# Patient Record
Sex: Male | Born: 1965 | Hispanic: Yes | Marital: Single | Smoking: Never smoker
Health system: Southern US, Community
[De-identification: ages and names within clinical notes are randomized; demographics above are authoritative.]

---

## 2015-10-09 ENCOUNTER — Encounter: Payer: Self-pay | Admitting: Emergency Medicine

## 2015-10-09 ENCOUNTER — Emergency Department: Payer: Worker's Compensation

## 2015-10-09 ENCOUNTER — Emergency Department
Admission: EM | Admit: 2015-10-09 | Discharge: 2015-10-09 | Disposition: A | Discharge status: Home / Self Care | Payer: Worker's Compensation | Attending: Emergency Medicine | Admitting: Emergency Medicine

## 2015-10-09 DIAGNOSIS — W312XXA Contact with powered woodworking and forming machines, initial encounter: Secondary | ICD-10-CM | POA: Insufficient documentation

## 2015-10-09 DIAGNOSIS — Y990 Civilian activity done for income or pay: Secondary | ICD-10-CM | POA: Diagnosis not present

## 2015-10-09 DIAGNOSIS — S62639A Displaced fracture of distal phalanx of unspecified finger, initial encounter for closed fracture: Secondary | ICD-10-CM

## 2015-10-09 DIAGNOSIS — S62636A Displaced fracture of distal phalanx of right little finger, initial encounter for closed fracture: Secondary | ICD-10-CM | POA: Insufficient documentation

## 2015-10-09 DIAGNOSIS — S6721XA Crushing injury of right hand, initial encounter: Secondary | ICD-10-CM | POA: Insufficient documentation

## 2015-10-09 DIAGNOSIS — S6990XA Unspecified injury of unspecified wrist, hand and finger(s), initial encounter: Secondary | ICD-10-CM | POA: Diagnosis present

## 2015-10-09 DIAGNOSIS — Y9269 Other specified industrial and construction area as the place of occurrence of the external cause: Secondary | ICD-10-CM | POA: Diagnosis not present

## 2015-10-09 DIAGNOSIS — Z23 Encounter for immunization: Secondary | ICD-10-CM | POA: Insufficient documentation

## 2015-10-09 DIAGNOSIS — S6722XA Crushing injury of left hand, initial encounter: Secondary | ICD-10-CM | POA: Insufficient documentation

## 2015-10-09 DIAGNOSIS — Y9389 Activity, other specified: Secondary | ICD-10-CM | POA: Insufficient documentation

## 2015-10-09 MED ORDER — TETANUS-DIPHTH-ACELL PERTUSSIS 5-2.5-18.5 LF-MCG/0.5 IM SUSP
0.5000 mL | Freq: Once | INTRAMUSCULAR | Status: AC
  Administered 2015-10-09: 0.5 mL via INTRAMUSCULAR
  Filled 2015-10-09: qty 0.5

## 2015-10-09 MED ORDER — ONDANSETRON 8 MG PO TBDP
8.0000 mg | ORAL_TABLET | Freq: Once | ORAL | Status: AC
  Administered 2015-10-09: 8 mg via ORAL
  Filled 2015-10-09: qty 1

## 2015-10-09 MED ORDER — MELOXICAM 15 MG PO TABS: 15.0000 mg | ORAL_TABLET | Freq: Every day | ORAL | 0 refills | Status: AC

## 2015-10-09 MED ORDER — LIDOCAINE HCL (PF) 1 % IJ SOLN
20.0000 mL | Freq: Once | INTRAMUSCULAR | Status: AC
  Administered 2015-10-09: 20 mL
  Filled 2015-10-09: qty 20

## 2015-10-09 MED ORDER — MORPHINE SULFATE (PF) 4 MG/ML IV SOLN
4.0000 mg | Freq: Once | INTRAVENOUS | Status: AC
  Administered 2015-10-09: 4 mg via INTRAMUSCULAR
  Filled 2015-10-09: qty 1

## 2015-10-09 MED ORDER — HYDROCODONE-ACETAMINOPHEN 5-325 MG PO TABS: 1.0000 | ORAL_TABLET | ORAL | 0 refills | Status: AC | PRN

## 2015-10-09 NOTE — ED Provider Notes (Signed)
[facility] Emergency Department Provider Note  ____________________________________________  Time seen: Approximately 11:00 AM  I have reviewed the triage vital signs and the nursing notes.   HISTORY  Chief Complaint Hand Pain    HPI Paul May is a 50 y.o. male who presents with injury to left hand and right hand.  Patient states that he was pushing molding between two metal pieces and "mashed" his right 5th digit and left 4th digit between the metal.  Patient expressing severe degree of pain, with the greatest pain felt in the distal right 5th digit. Patient reports bleeding from the fourth digit of the left hand but is unsure of whether he suffered a laceration or not. Patient reports full range of motion to both digits. No other injury or complaint. Patient's last tetanus shot was 6-7 years ago.   History reviewed. No pertinent past medical history.  There are no active problems to display for this patient.   History reviewed. No pertinent surgical history.  Prior to Admission medications   Medication Sig Start Date End Date Taking? Authorizing Provider  HYDROcodone-acetaminophen (NORCO/VICODIN) 5-325 MG tablet Take 1 tablet by mouth every 4 (four) hours as needed for moderate pain. 10/09/15   Delorise Royals Cuthriell, PA-C  meloxicam (MOBIC) 15 MG tablet Take 1 tablet (15 mg total) by mouth daily. 10/09/15   Delorise Royals Cuthriell, PA-C    Allergies Review of patient's allergies indicates no known allergies.  No family history on file.  Social History Social History  Substance Use Topics  . Smoking status: Never Smoker  . Smokeless tobacco: Never Used  . Alcohol use Yes     Review of Systems  Constitutional: No fever/chills Cardiovascular: no chest pain. Respiratory: no cough. No SOB. Gastrointestinal:  No nausea, no vomiting.   Musculoskeletal: Positive for pain to the fifth digit of the right hand and fourth digit of the left hand.  Positive for bleeding to left finger, patient is unsure whether her finger was lacerated or more bleeding is coming from. Skin: Negative for rash, abrasions, lacerations, ecchymosis. Neurological: Negative for headaches, focal weakness or numbness. 10-point ROS otherwise negative.  ____________________________________________   PHYSICAL EXAM:  VITAL SIGNS: ED Triage Vitals [10/09/15 1048]  Enc Vitals Group     BP (!) 131/94     Pulse Rate 71     Resp 20     Temp 97.5 F (36.4 C)     Temp src      SpO2 98 %     Weight 165 lb (74.8 kg)     Height 5\' 5"  (1.651 m)     Head Circumference      Peak Flow      Pain Score 10     Pain Loc      Pain Edu?      Excl. in GC?      Constitutional: Alert and oriented. Well appearing and in no acute distress. Eyes: Conjunctivae are normal. PERRL. EOMI. Head: Atraumatic. Cardiovascular: Normal rate, regular rhythm. Normal S1 and S2.  Good peripheral circulation. Respiratory: Normal respiratory effort without tachypnea or retractions. Lungs CTAB. Good air entry to the bases with no decreased or absent breath sounds. Musculoskeletal: Full range of motion to all extremities. No gross deformities appreciated.Edema is noted to the distal aspect of the fifth digit right hand. Ecchymosis is noted. No subungual hematoma is appreciated. Area is extremely tender to palpation. No palpable abnormality. Full range of motion to finger. Examination of the  right hand otherwise is unremarkable. Examination of the fourth digit of the left hand reveals injury to the proximal nailbed. Mild bleeding from site. No definitive laceration. Ecchymosis underlying fingernail but no indication of acute subungual hematoma. Sensation and cap refill intact 5 digits both hands. Neurologic:  Normal speech and language. No gross focal neurologic deficits are appreciated.  Skin:  Skin is warm, dry and intact. No rash noted. Psychiatric: Mood and affect are normal. Speech and  behavior are normal. Patient exhibits appropriate insight and judgement.   ____________________________________________   LABS (all labs ordered are listed, but only abnormal results are displayed)  Labs Reviewed - No data to display ____________________________________________  EKG   ____________________________________________  RADIOLOGY Festus BarrenI, Jonathan D Cuthriell, personally viewed and evaluated these images (plain radiographs) as part of my medical decision making, as well as reviewing the written report by the radiologist.  Dg Hand Complete Left  Result Date: 10/09/2015 CLINICAL DATA:  Crush injury at work. EXAM: LEFT HAND - COMPLETE 3+ VIEW COMPARISON:  None. FINDINGS: Artifact overlies the distal ring finger. Question minimal fracture of the distal tuft. No other acute bone finding. 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 Tiny metallic density foreign object within the soft tissues in the web between the index and long fingers and in the region of the distal thumb nail bed. IMPRESSION: Question minimal fracture of the distal tuft of the ring finger. Artifact overlies this region. Electronically Signed   By: Paulina FusiMark  Shogry M.D.   On: 10/09/2015 11:47   Dg Hand Complete Right  Result Date: 10/09/2015 CLINICAL DATA:  Crush injury at work. EXAM: RIGHT HAND - COMPLETE 3+ VIEW COMPARISON:  None. FINDINGS: There is a fracture of the distal tuft of the small finger. Question minimal fracture of the distal tuft of the ring finger. No other abnormality. IMPRESSION: Definite fracture of the distal tuft of the small finger. Question minimal fracture of the distal tuft of the ring finger. Electronically Signed   By: Paulina FusiMark  Shogry M.D.   On: 10/09/2015 11:49    ____________________________________________    PROCEDURES  Procedure(s) performed:    Procedures    Medications  morphine 4 MG/ML injection 4 mg (4 mg Intramuscular Given 10/09/15 1110)  ondansetron (ZOFRAN-ODT)  disintegrating tablet 8 mg (8 mg Oral Given 10/09/15 1110)  lidocaine (PF) (XYLOCAINE) 1 % injection 20 mL (20 mLs Infiltration Given 10/09/15 1324)  Tdap (BOOSTRIX) injection 0.5 mL (0.5 mLs Intramuscular Given 10/09/15 1237)     ____________________________________________   INITIAL IMPRESSION / ASSESSMENT AND PLAN / ED COURSE  Pertinent labs & imaging results that were available during my care of the patient were reviewed by me and considered in my medical decision making (see chart for details).  Review of the [place] CSRS was performed in accordance of the [facility] prior to dispensing any controlled drugs.  Clinical Course    Patient's diagnosis is consistent with Crush injuries to the left and right hand. There is a fracture to the distal phalanx of the fifth digit right hand. Mild bleeding noted from nail bed of the fourth digit left hand. X-rays were obtained of both hands and revealed the above fracture. Patient was very upset initially with provider stating "I have blood building up in my finger and he needs to drain it." he was advised that he has swelling due to his injury but there is no indication of frank blood collection.  Patient does have bruising under the fingernails but there is no  indication of true subungual hematoma. Fourth digit of the left hand fifth digit of the right hand are splinted.Initially patient argued and was upset when I would not incise patient's soft tissues. However, after treatment with splinting, prescriptions, explanation of patient's clinical course, exam findings, diagnosis, patient became agreeable with diagnosis and treatment plan.. Patient will be discharged home with prescriptions for pain medication and anti-inflammatories. Patient is to follow up with hand surgeon. Patient is given ED precautions to return to the ED for any worsening or new symptoms.     ____________________________________________  FINAL CLINICAL IMPRESSION(S) / ED DIAGNOSES  Final  diagnoses:  Crush injury of hand, left, initial encounter  Crush injury of hand, right, initial encounter  Fracture, finger, distal phalanx, closed, initial encounter      NEW MEDICATIONS STARTED DURING THIS VISIT:  Discharge Medication List as of 10/09/2015  1:16 PM    START taking these medications   Details  HYDROcodone-acetaminophen (NORCO/VICODIN) 5-325 MG tablet Take 1 tablet by mouth every 4 (four) hours as needed for moderate pain., Starting Fri 10/09/2015, Print    meloxicam (MOBIC) 15 MG tablet Take 1 tablet (15 mg total) by mouth daily., Starting Fri 10/09/2015, Print            This chart was dictated using voice recognition software/Dragon. Despite best efforts to proofread, errors can occur which can change the meaning. Any change was purely unintentional.    Racheal Patches, PA-C 10/09/15 1426    Jeanmarie Plant, MD 10/09/15 [phone]

## 2015-10-09 NOTE — ED Triage Notes (Signed)
States he got both hands caught in a press at work

## 2015-10-15 ENCOUNTER — Encounter (HOSPITAL_BASED_OUTPATIENT_CLINIC_OR_DEPARTMENT_OTHER): Payer: Self-pay | Admitting: *Deleted

## 2015-10-15 ENCOUNTER — Other Ambulatory Visit: Payer: Self-pay | Admitting: Orthopedic Surgery

## 2015-10-16 ENCOUNTER — Encounter (HOSPITAL_BASED_OUTPATIENT_CLINIC_OR_DEPARTMENT_OTHER)
Admission: RE | Disposition: A | Discharge status: Home / Self Care | Payer: Self-pay | Source: Ambulatory Visit | Attending: Orthopedic Surgery

## 2015-10-16 ENCOUNTER — Ambulatory Visit (HOSPITAL_BASED_OUTPATIENT_CLINIC_OR_DEPARTMENT_OTHER): Payer: Worker's Compensation | Admitting: Anesthesiology

## 2015-10-16 ENCOUNTER — Ambulatory Visit (HOSPITAL_BASED_OUTPATIENT_CLINIC_OR_DEPARTMENT_OTHER)
Admission: RE | Admit: 2015-10-16 | Discharge: 2015-10-16 | Disposition: A | Discharge status: Home / Self Care | Payer: Worker's Compensation | Source: Ambulatory Visit | Attending: Orthopedic Surgery | Admitting: Orthopedic Surgery

## 2015-10-16 ENCOUNTER — Encounter (HOSPITAL_BASED_OUTPATIENT_CLINIC_OR_DEPARTMENT_OTHER): Payer: Self-pay | Admitting: Anesthesiology

## 2015-10-16 DIAGNOSIS — S62636B Displaced fracture of distal phalanx of right little finger, initial encounter for open fracture: Secondary | ICD-10-CM | POA: Diagnosis present

## 2015-10-16 DIAGNOSIS — S61316A Laceration without foreign body of right little finger with damage to nail, initial encounter: Secondary | ICD-10-CM | POA: Diagnosis not present

## 2015-10-16 DIAGNOSIS — S61314A Laceration without foreign body of right ring finger with damage to nail, initial encounter: Secondary | ICD-10-CM | POA: Insufficient documentation

## 2015-10-16 DIAGNOSIS — S62634B Displaced fracture of distal phalanx of right ring finger, initial encounter for open fracture: Secondary | ICD-10-CM | POA: Diagnosis not present

## 2015-10-16 HISTORY — PX: NAILBED REPAIR: SHX5028

## 2015-10-16 SURGERY — REPAIR, NAIL BED
Anesthesia: Monitor Anesthesia Care | Site: Finger | Laterality: Bilateral

## 2015-10-16 MED ORDER — CEFAZOLIN SODIUM-DEXTROSE 2-4 GM/100ML-% IV SOLN
INTRAVENOUS | Status: AC
  Filled 2015-10-16: qty 100

## 2015-10-16 MED ORDER — DOXYCYCLINE HYCLATE 50 MG PO CAPS: 50.0000 mg | ORAL_CAPSULE | Freq: Two times a day (BID) | ORAL | 0 refills | Status: AC

## 2015-10-16 MED ORDER — PROPOFOL 10 MG/ML IV BOLUS
INTRAVENOUS | Status: DC | PRN
  Administered 2015-10-16 (×2): 50 mg via INTRAVENOUS

## 2015-10-16 MED ORDER — ONDANSETRON HCL 4 MG/2ML IJ SOLN
INTRAMUSCULAR | Status: AC
  Filled 2015-10-16: qty 2

## 2015-10-16 MED ORDER — OXYCODONE HCL 5 MG PO TABS
ORAL_TABLET | ORAL | Status: AC
  Filled 2015-10-16: qty 1

## 2015-10-16 MED ORDER — CEFAZOLIN SODIUM-DEXTROSE 2-4 GM/100ML-% IV SOLN
2.0000 g | INTRAVENOUS | Status: AC
  Administered 2015-10-16: 2 g via INTRAVENOUS

## 2015-10-16 MED ORDER — MIDAZOLAM HCL 2 MG/2ML IJ SOLN: 1.0000 mg | INTRAMUSCULAR | Status: DC | PRN

## 2015-10-16 MED ORDER — FENTANYL CITRATE (PF) 100 MCG/2ML IJ SOLN: 50.0000 ug | INTRAMUSCULAR | Status: DC | PRN

## 2015-10-16 MED ORDER — FENTANYL CITRATE (PF) 100 MCG/2ML IJ SOLN
INTRAMUSCULAR | Status: AC
  Filled 2015-10-16: qty 2

## 2015-10-16 MED ORDER — MIDAZOLAM HCL 5 MG/5ML IJ SOLN
INTRAMUSCULAR | Status: DC | PRN
  Administered 2015-10-16: 2 mg via INTRAVENOUS

## 2015-10-16 MED ORDER — LIDOCAINE HCL (PF) 1 % IJ SOLN
INTRAMUSCULAR | Status: AC
  Filled 2015-10-16: qty 30

## 2015-10-16 MED ORDER — BUPIVACAINE HCL (PF) 0.25 % IJ SOLN
INTRAMUSCULAR | Status: AC
  Filled 2015-10-16: qty 30

## 2015-10-16 MED ORDER — PROPOFOL 500 MG/50ML IV EMUL
INTRAVENOUS | Status: AC
  Filled 2015-10-16: qty 50

## 2015-10-16 MED ORDER — GLYCOPYRROLATE 0.2 MG/ML IJ SOLN: 0.2000 mg | Freq: Once | INTRAMUSCULAR | Status: DC | PRN

## 2015-10-16 MED ORDER — LIDOCAINE HCL 1 % IJ SOLN
INTRAMUSCULAR | Status: DC | PRN
  Administered 2015-10-16: 12 mL via INTRAMUSCULAR

## 2015-10-16 MED ORDER — FENTANYL CITRATE (PF) 100 MCG/2ML IJ SOLN
INTRAMUSCULAR | Status: DC | PRN
  Administered 2015-10-16 (×2): 50 ug via INTRAVENOUS

## 2015-10-16 MED ORDER — DEXAMETHASONE SODIUM PHOSPHATE 10 MG/ML IJ SOLN
INTRAMUSCULAR | Status: AC
  Filled 2015-10-16: qty 1

## 2015-10-16 MED ORDER — MIDAZOLAM HCL 2 MG/2ML IJ SOLN
INTRAMUSCULAR | Status: AC
  Filled 2015-10-16: qty 2

## 2015-10-16 MED ORDER — LIDOCAINE 2% (20 MG/ML) 5 ML SYRINGE
INTRAMUSCULAR | Status: AC
  Filled 2015-10-16: qty 5

## 2015-10-16 MED ORDER — LACTATED RINGERS IV SOLN
INTRAVENOUS | Status: DC
  Administered 2015-10-16 (×2): via INTRAVENOUS

## 2015-10-16 MED ORDER — OXYCODONE HCL 5 MG PO TABS
5.0000 mg | ORAL_TABLET | ORAL | Status: DC | PRN
  Administered 2015-10-16: 5 mg via ORAL

## 2015-10-16 MED ORDER — SCOPOLAMINE 1 MG/3DAYS TD PT72: 1.0000 | MEDICATED_PATCH | Freq: Once | TRANSDERMAL | Status: DC | PRN

## 2015-10-16 MED ORDER — CHLORHEXIDINE GLUCONATE 4 % EX LIQD: 60.0000 mL | Freq: Once | CUTANEOUS | Status: DC

## 2015-10-16 MED ORDER — LIDOCAINE HCL (CARDIAC) 20 MG/ML IV SOLN
INTRAVENOUS | Status: DC | PRN
  Administered 2015-10-16: 10 mg via INTRAVENOUS

## 2015-10-16 MED ORDER — HYDROCODONE-ACETAMINOPHEN 5-325 MG PO TABS: 1.0000 | ORAL_TABLET | Freq: Four times a day (QID) | ORAL | 0 refills | Status: AC | PRN

## 2015-10-16 MED ORDER — ONDANSETRON HCL 4 MG/2ML IJ SOLN
INTRAMUSCULAR | Status: DC | PRN
  Administered 2015-10-16: 4 mg via INTRAVENOUS

## 2015-10-16 SURGICAL SUPPLY — 41 items
BLADE MINI RND TIP GREEN BEAV (BLADE) IMPLANT
BLADE SURG 15 STRL LF DISP TIS (BLADE) ×1 IMPLANT
BLADE SURG 15 STRL SS (BLADE) ×2
BNDG COHESIVE 1X5 TAN STRL LF (GAUZE/BANDAGES/DRESSINGS) ×3 IMPLANT
BNDG ESMARK 4X9 LF (GAUZE/BANDAGES/DRESSINGS) ×3 IMPLANT
CHLORAPREP W/TINT 26ML (MISCELLANEOUS) ×6 IMPLANT
CORDS BIPOLAR (ELECTRODE) IMPLANT
COVER BACK TABLE 60X90IN (DRAPES) ×3 IMPLANT
COVER MAYO STAND STRL (DRAPES) ×6 IMPLANT
CUFF TOURNIQUET SINGLE 18IN (TOURNIQUET CUFF) IMPLANT
DRAIN PENROSE 1/2X12 LTX STRL (WOUND CARE) ×3 IMPLANT
DRAPE EXTREMITY T 121X128X90 (DRAPE) ×6 IMPLANT
DRAPE SURG 17X23 STRL (DRAPES) ×6 IMPLANT
GAUZE SPONGE 4X4 12PLY STRL (GAUZE/BANDAGES/DRESSINGS) ×3 IMPLANT
GAUZE XEROFORM 1X8 LF (GAUZE/BANDAGES/DRESSINGS) ×3 IMPLANT
GLOVE BIOGEL PI IND STRL 7.0 (GLOVE) ×1 IMPLANT
GLOVE BIOGEL PI IND STRL 8.5 (GLOVE) ×1 IMPLANT
GLOVE BIOGEL PI INDICATOR 7.0 (GLOVE) ×2
GLOVE BIOGEL PI INDICATOR 8.5 (GLOVE) ×2
GLOVE ECLIPSE 6.5 STRL STRAW (GLOVE) ×3 IMPLANT
GLOVE EXAM NITRILE EXT CUFF MD (GLOVE) ×6 IMPLANT
GLOVE SURG ORTHO 8.0 STRL STRW (GLOVE) ×3 IMPLANT
GOWN STRL REUS W/ TWL LRG LVL3 (GOWN DISPOSABLE) ×1 IMPLANT
GOWN STRL REUS W/TWL LRG LVL3 (GOWN DISPOSABLE) ×2
GOWN STRL REUS W/TWL XL LVL3 (GOWN DISPOSABLE) ×3 IMPLANT
NEEDLE PRECISIONGLIDE 27X1.5 (NEEDLE) ×3 IMPLANT
NS IRRIG 1000ML POUR BTL (IV SOLUTION) ×3 IMPLANT
PACK BASIN DAY SURGERY FS (CUSTOM PROCEDURE TRAY) ×3 IMPLANT
PADDING CAST ABS 4INX4YD NS (CAST SUPPLIES)
PADDING CAST ABS COTTON 4X4 ST (CAST SUPPLIES) IMPLANT
SPLINT FINGER 3.25 BULB 911905 (SOFTGOODS) ×6 IMPLANT
STOCKINETTE 4X48 STRL (DRAPES) ×6 IMPLANT
SUT CHROMIC 6 0 G 1 (SUTURE) ×3 IMPLANT
SUT ETHILON 4 0 PS 2 18 (SUTURE) IMPLANT
SUT VIC AB 4-0 P2 18 (SUTURE) IMPLANT
SWAB COLLECTION DEVICE MRSA (MISCELLANEOUS) ×3 IMPLANT
SWAB CULTURE ESWAB REG 1ML (MISCELLANEOUS) ×3 IMPLANT
SYR BULB 3OZ (MISCELLANEOUS) ×3 IMPLANT
SYR CONTROL 10ML LL (SYRINGE) ×3 IMPLANT
TOWEL OR 17X24 6PK STRL BLUE (TOWEL DISPOSABLE) ×6 IMPLANT
UNDERPAD 30X30 (UNDERPADS AND DIAPERS) ×6 IMPLANT

## 2015-10-16 NOTE — Brief Op Note (Signed)
10/16/2015  2:25 PM  PATIENT:  Paul May  50 y.o. male  PRE-OPERATIVE DIAGNOSIS:  CRUSH INJURY RIGHT SMALL AND LEFT RING FINGERS.  FRACTURE DISTAL PHALANX LEFT RING FINGER.   562.609B , 567.22YA, 567.21YA, 562.635B  POST-OPERATIVE DIAGNOSIS:  CRUSH INJURY RIGHT SMALL AND LEFT RING FINGERS.  FRACTURE DISTAL PHALANX LEFT RING FINGER.   562.609B , 567.22YA, 567.21YA, 562.635B  PROCEDURE:  Procedure(s): RIGHT SMALL FINGER AND LEFT RING FINGER NAILBED REPAIRS (Bilateral)  SURGEON:  Surgeon(s) and Role:    * Cindee SaltGary Alexsys Eskin, MD - Primary  PHYSICIAN ASSISTANT:   ASSISTANTS: none   ANESTHESIA:   local and IV sedation  EBL:  Total I/O In: 1500 [I.V.:1500] Out: 3 [Blood:3]  BLOOD ADMINISTERED:none  DRAINS: none   LOCAL MEDICATIONS USED:  BUPIVICAINE and xylocaine  SPECIMEN:  Excision  DISPOSITION OF SPECIMEN:  PATHOLOGY  COUNTS:  YES  TOURNIQUET:  * No tourniquets in log *  DICTATION: .Other Dictation: Dictation Number [id number]  PLAN OF CARE: Discharge to home after PACU  PATIENT DISPOSITION:  PACU - hemodynamically stable.

## 2015-10-16 NOTE — Discharge Instructions (Addendum)

## 2015-10-16 NOTE — Transfer of Care (Signed)
Immediate Anesthesia Transfer of Care Note  Patient: Paul May  Procedure(s) Performed: Procedure(s): RIGHT SMALL FINGER AND LEFT RING FINGER NAILBED REPAIRS (Bilateral)  Patient Location: PACU  Anesthesia Type:MAC  Level of Consciousness: awake and patient cooperative  Airway & Oxygen Therapy: Patient Spontanous Breathing and Patient connected to face mask oxygen  Post-op Assessment: Report given to RN and Post -op Vital signs reviewed and stable  Post vital signs: Reviewed and stable  Last Vitals:  Vitals:   10/16/15 1234  BP: 121/73  Pulse: (!) 59  Resp: 18  Temp: 36.6 C    Last Pain:  Vitals:   10/16/15 1234  TempSrc: Oral  PainSc: 0-No pain      Patients Stated Pain Goal: 0 (10/16/15 1234)  Complications: No apparent anesthesia complications

## 2015-10-16 NOTE — H&P (Signed)
Paul DragonFelipe Luther May Vanessa BarbaraZamora is an 50 y.o. male.   Chief Complaint: crush injury right small and left ring finger HPI: Paul May is a 50 year old right-hand-dominant male who comes in referred from [facility]. He was seen there following a crush injury to his bilateral hands right small finger left ring finger. This occurred while he was at work. When it was caught in a mold press. He suffered injuries to the distal phalanges of each of the fingers. He complains of pain in his right small finger with a heavy feeling in a VAS score of 5/10. He states his pain has improved on his left ring finger. He is not complaining of much in the way of discomfort at this time. His he was not given an antibiotic. He was placed in dressings by [place]. The injury occurred on 10/09/2015. He has no prior history of injuries. Has no history of diabetes thyroid problems arthritis or gout. Complains of some radiating pain with a heavy feeling going up his midforearm on his right side from his small finger. His family history is negative for diabetes thyroid problems arthritis and gout. History reviewed. No pertinent past medical history.  History reviewed. No pertinent surgical history.      History reviewed. No pertinent past medical history.  History reviewed. No pertinent surgical history.  History reviewed. No pertinent family history. Social History:  reports that he has never smoked. He has never used smokeless tobacco. He reports that he drinks alcohol. He reports that he does not use drugs.  Allergies: No Known Allergies  Medications Prior to Admission  Medication Sig Dispense Refill  . HYDROcodone-acetaminophen (NORCO/VICODIN) 5-325 MG tablet Take 1 tablet by mouth every 4 (four) hours as needed for moderate pain. 20 tablet 0  . meloxicam (MOBIC) 15 MG tablet Take 1 tablet (15 mg total) by mouth daily. 30 tablet 0    No results found for this or any previous visit (from the past 48 hour(s)).  No results  found.   Pertinent items are noted in HPI.  Blood pressure 121/73, pulse (!) 59, temperature 97.8 F (36.6 C), temperature source Oral, resp. rate 18, height 5\' 5"  (1.651 m), weight 74.8 kg (165 lb), SpO2 98 %.  General appearance: alert, cooperative and appears stated age Head: Normocephalic, without obvious abnormality Neck: no JVD Resp: clear to auscultation bilaterally Cardio: regular rate and rhythm, S1, S2 normal, no murmur, click, rub or gallop GI: soft, non-tender; bowel sounds normal; no masses,  no organomegaly Extremities: crush injuries left and right hands Pulses: 2+ and symmetric Skin: Skin color, texture, turgor normal. No rashes or lesions Neurologic: Grossly normal Incision/Wound:  na  Assessment/Plan Assessment:  1. Fracture of phalanx of right hand, open, initial encounter [facility]  2. Crushing injury of finger of left hand, initial encounter [facility]  3. Crushing injury of finger, right, initial encounter [facility]  4. Open displaced fracture of distal phalanx of left ring finger, initial encounter [facility]    Plan: He would recommend a removal of both nail plates with repair of the nail beds on each side. Will be scheduled as an outpatient under MAC anesthesia. We will place him on Celebrex 200 mg 1-2 a day. We will hold off antibiotics until we can take cultures . he will be out of work. He is sent to therapy for protective splints.      Raylyn Carton  R 10/16/2015, 1:33 PM

## 2015-10-16 NOTE — Anesthesia Postprocedure Evaluation (Signed)
Anesthesia Post Note  Patient: Patrick JupiterFelipe Molina Zamora  Procedure(s) Performed: Procedure(s) (LRB): RIGHT SMALL FINGER AND LEFT RING FINGER NAILBED REPAIRS (Bilateral)  Patient location during evaluation: PACU Anesthesia Type: MAC Level of consciousness: awake and alert Pain management: pain level controlled Vital Signs Assessment: post-procedure vital signs reviewed and stable Respiratory status: spontaneous breathing, nonlabored ventilation and respiratory function stable Cardiovascular status: stable and blood pressure returned to baseline Anesthetic complications: no    Last Vitals:  Vitals:   10/16/15 1430 10/16/15 1445  BP: (!) 148/89   Pulse: (!) 52 (!) 49  Resp: 13 10  Temp: 36.4 C     Last Pain:  Vitals:   10/16/15 1500  TempSrc:   PainSc: 1                  Linton RumpJennifer Dickerson Rihana Kiddy

## 2015-10-16 NOTE — Op Note (Signed)
Dictation Number [id number]

## 2015-10-16 NOTE — Anesthesia Preprocedure Evaluation (Addendum)
Anesthesia Evaluation  Patient identified by MRN, date of birth, ID band Patient awake    Reviewed: Allergy & Precautions, NPO status , Patient's Chart, lab work & pertinent test results  History of Anesthesia Complications Negative for: history of anesthetic complications  Airway Mallampati: I  TM Distance: >3 FB Neck ROM: Full    Dental  (+)    Pulmonary neg pulmonary ROS,    Pulmonary exam normal breath sounds clear to auscultation       Cardiovascular negative cardio ROS   Rhythm:Regular Rate:Normal     Neuro/Psych negative neurological ROS     GI/Hepatic negative GI ROS, Neg liver ROS,   Endo/Other  negative endocrine ROS  Renal/GU negative Renal ROS     Musculoskeletal   Abdominal   Peds negative pediatric ROS (+)  Hematology   Anesthesia Other Findings   Reproductive/Obstetrics                            Anesthesia Physical Anesthesia Plan  ASA: I  Anesthesia Plan: MAC   Post-op Pain Management:    Induction: Intravenous  Airway Management Planned: Natural Airway and Simple Face Mask  Additional Equipment:   Intra-op Plan:   Post-operative Plan:   Informed Consent: I have reviewed the patients History and Physical, chart, labs and discussed the procedure including the risks, benefits and alternatives for the proposed anesthesia with the patient or authorized representative who has indicated his/her understanding and acceptance.   Dental advisory given  Plan Discussed with: CRNA  Anesthesia Plan Comments:        Anesthesia Quick Evaluation

## 2015-10-17 NOTE — Op Note (Signed)
NAMPatrick May:  MOLINA ZAMORA, [name]        ACCOUNT NO.:  [id number]  MEDICAL RECORD NO.:  [id number]  LOCATION:  ED42A                        FACILITY:  [facility]  PHYSICIAN:  Cindee SaltGary Sorina Derrig, M.D.       DATE OF BIRTH:  November 28, 1965  DATE OF PROCEDURE: DATE OF DISCHARGE:  10/09/2015                              OPERATIVE REPORT   PREOPERATIVE DIAGNOSIS: 1. Crush injury with nail bed injury. 2. Fractures right small finger distal phalanx and left ring finger     distal phalanx.  POSTOPERATIVE DIAGNOSIS: 1. Crush injury with nail bed injury. 2. Fractures right small finger distal phalanx and left ring finger     distal phalanx.  OPERATION: 1. Removal of nail plates. 2. Debridement of nail beds with repair of nail beds, right small     finger and left ring finger. 3. Cultures taken, left ring finger.  SURGEON:  Cindee SaltGary Deniro Laymon, MD.  ASSISTANT:  None.  ANESTHESIA:  Sedation with metacarpal blocks on each digit.  ANESTHESIOLOGIST:  Dr. Freida BusmanAllen.  HISTORY:  The patient is a 50 year old male, who suffered crush injuries to his right small finger and left ring finger.  He was seen in an outside emergency room, where bandages were placed.  He was seen with an avulsion of the proximal aspect of the left ring finger nail plate, which is sitting on top of the dorsal nail matrix proximally.  He has a large subungual hematoma under each nail.  He is admitted for removal of nail plates and repair of nail beds on each finger.  Pre, peri, and postoperative course have been discussed along with risks and complications.  He is aware there is no guarantee with the surgery, the possibility of infection, recurrence of injury to arteries, nerves, tendons, incomplete relief of symptoms, and dystrophy.  In the preoperative area, the patient was seen.  The extremity was marked by both the patient and the surgeon.  PROCEDURE IN DETAIL:  The patient was brought to the operating room, where a sedation was given.  He was  prepped with Betadine scrub and solution in a supine position with both arms free.  Time-out was taken confirming the patient and the procedure.  The ring finger was attended to first.  A metacarpal block was given with 0.25% bupivacaine and 1% Xylocaine, both without epinephrine, 6 mL total was used.  A Penrose drain was used for tourniquet control at the base of the finger after exsanguination with a gauze sponge.  The nail plate was then removed. It was only attached to very distal aspect of the nail matrix.  Purulent material immediately extruded.  Cultures were taken.  The area was sharply debrided with a knife removing black necrotic tissue.  This was sent for culture for both aerobic and anaerobic cultures.  The wound was copiously irrigated with saline.  The nail matrix was then repaired with interrupted 6-0 chromic sutures.  A nonadherent gauze was placed into the nail fold.  A sterile compressive dressing was applied.  The right small finger was attended to next.  This was exsanguinated with a moistened gauze sponge.  A Penrose drain again used for tourniquet control at the base of the finger.  This was done after  a metacarpal block was given with 0.25% bupivacaine and 1% Xylocaine, both without epinephrine.  Again total mL used were 6 for the small finger on the right hand.  The nail plate was removed without difficulty.  A stellate laceration involving almost the entire length of the nail matrix was noted, this was irrigated.  There was no necrotic tissue. The nail bed was then repaired with interrupted 6-0 chromic sutures.  A nail plate, which had been removed, was soaked in Betadine, any soft tissue removed from it.  This was then placed into the nail fold proximally.  A sterile compressive dressing and splint was applied over this.  The splint was then applied to the left ring finger.  The patient tolerated the procedure well and was taken to the recovery room for  observation in satisfactory condition.  It was noted that the Penrose drain was shortly removed from each finger prior to taking him to the recovery room.  The patient will be discharged home to return the [facility] in 1 week, on Norco and doxycycline.          ______________________________ Cindee Salt, M.D.     GK/MEDQ  D:  10/16/2015  T:  10/17/2015  Job:  [id number]

## 2015-10-19 ENCOUNTER — Encounter (HOSPITAL_BASED_OUTPATIENT_CLINIC_OR_DEPARTMENT_OTHER): Payer: Self-pay | Admitting: Orthopedic Surgery

## 2015-10-20 LAB — AEROBIC/ANAEROBIC CULTURE (SURGICAL/DEEP WOUND): GRAM STAIN: NONE SEEN

## 2015-10-20 LAB — AEROBIC/ANAEROBIC CULTURE W GRAM STAIN (SURGICAL/DEEP WOUND)

## 2017-04-05 ENCOUNTER — Emergency Department (HOSPITAL_COMMUNITY): Payer: Worker's Compensation

## 2017-04-05 ENCOUNTER — Emergency Department (HOSPITAL_COMMUNITY)
Admission: EM | Admit: 2017-04-05 | Discharge: 2017-04-05 | Disposition: A | Discharge status: Home / Self Care | Payer: Worker's Compensation | Attending: Emergency Medicine | Admitting: Emergency Medicine

## 2017-04-05 ENCOUNTER — Encounter (HOSPITAL_COMMUNITY): Payer: Self-pay | Admitting: *Deleted

## 2017-04-05 DIAGNOSIS — Y929 Unspecified place or not applicable: Secondary | ICD-10-CM | POA: Diagnosis not present

## 2017-04-05 DIAGNOSIS — Z23 Encounter for immunization: Secondary | ICD-10-CM | POA: Insufficient documentation

## 2017-04-05 DIAGNOSIS — S0990XA Unspecified injury of head, initial encounter: Secondary | ICD-10-CM

## 2017-04-05 DIAGNOSIS — Y990 Civilian activity done for income or pay: Secondary | ICD-10-CM | POA: Insufficient documentation

## 2017-04-05 DIAGNOSIS — Y939 Activity, unspecified: Secondary | ICD-10-CM | POA: Diagnosis not present

## 2017-04-05 DIAGNOSIS — S060X1A Concussion with loss of consciousness of 30 minutes or less, initial encounter: Secondary | ICD-10-CM

## 2017-04-05 DIAGNOSIS — R4182 Altered mental status, unspecified: Secondary | ICD-10-CM | POA: Diagnosis not present

## 2017-04-05 DIAGNOSIS — T1490XA Injury, unspecified, initial encounter: Secondary | ICD-10-CM

## 2017-04-05 DIAGNOSIS — S0101XA Laceration without foreign body of scalp, initial encounter: Secondary | ICD-10-CM | POA: Diagnosis not present

## 2017-04-05 DIAGNOSIS — R402412 Glasgow coma scale score 13-15, at arrival to emergency department: Secondary | ICD-10-CM | POA: Insufficient documentation

## 2017-04-05 DIAGNOSIS — W228XXA Striking against or struck by other objects, initial encounter: Secondary | ICD-10-CM | POA: Insufficient documentation

## 2017-04-05 LAB — URINALYSIS, ROUTINE W REFLEX MICROSCOPIC
BILIRUBIN URINE: NEGATIVE
Glucose, UA: NEGATIVE mg/dL
Hgb urine dipstick: NEGATIVE
KETONES UR: NEGATIVE mg/dL
LEUKOCYTES UA: NEGATIVE
NITRITE: NEGATIVE
PROTEIN: NEGATIVE mg/dL
Specific Gravity, Urine: 1.017 (ref 1.005–1.030)
pH: 6 (ref 5.0–8.0)

## 2017-04-05 LAB — I-STAT CHEM 8, ED
BUN: 10 mg/dL (ref 6–20)
Calcium, Ion: 1.03 mmol/L — ABNORMAL LOW (ref 1.15–1.40)
Chloride: 106 mmol/L (ref 101–111)
Creatinine, Ser: 0.6 mg/dL — ABNORMAL LOW (ref 0.61–1.24)
GLUCOSE: 117 mg/dL — AB (ref 65–99)
HCT: 52 % (ref 39.0–52.0)
HEMOGLOBIN: 17.7 g/dL — AB (ref 13.0–17.0)
Potassium: 4 mmol/L (ref 3.5–5.1)
Sodium: 141 mmol/L (ref 135–145)
TCO2: 24 mmol/L (ref 22–32)

## 2017-04-05 LAB — COMPREHENSIVE METABOLIC PANEL
ALT: 39 U/L (ref 17–63)
AST: 44 U/L — AB (ref 15–41)
Albumin: 4.2 g/dL (ref 3.5–5.0)
Alkaline Phosphatase: 86 U/L (ref 38–126)
Anion gap: 12 (ref 5–15)
BILIRUBIN TOTAL: 1 mg/dL (ref 0.3–1.2)
BUN: 8 mg/dL (ref 6–20)
CHLORIDE: 106 mmol/L (ref 101–111)
CO2: 22 mmol/L (ref 22–32)
CREATININE: 0.64 mg/dL (ref 0.61–1.24)
Calcium: 9.1 mg/dL (ref 8.9–10.3)
GFR calc non Af Amer: >60 mL/min (ref >60)
Glucose, Bld: 121 mg/dL — ABNORMAL HIGH (ref 65–99)
POTASSIUM: 4 mmol/L (ref 3.5–5.1)
Sodium: 140 mmol/L (ref 135–145)
TOTAL PROTEIN: 6.9 g/dL (ref 6.5–8.1)

## 2017-04-05 LAB — PROTIME-INR
INR: 0.97
PROTHROMBIN TIME: 12.8 s (ref 11.4–15.2)

## 2017-04-05 LAB — I-STAT CG4 LACTIC ACID, ED: LACTIC ACID, VENOUS: 2.26 mmol/L — AB (ref 0.5–1.9)

## 2017-04-05 LAB — ETHANOL: Alcohol, Ethyl (B): <10 mg/dL (ref <10)

## 2017-04-05 LAB — CBC
HEMATOCRIT: 50.9 % (ref 39.0–52.0)
Hemoglobin: 18.6 g/dL — ABNORMAL HIGH (ref 13.0–17.0)
MCH: 31.8 pg (ref 26.0–34.0)
MCHC: 36.5 g/dL — ABNORMAL HIGH (ref 30.0–36.0)
MCV: 87.2 fL (ref 78.0–100.0)
PLATELETS: 120 10*3/uL — AB (ref 150–400)
RBC: 5.84 MIL/uL — AB (ref 4.22–5.81)
RDW: 13.6 % (ref 11.5–15.5)
WBC: 7.2 10*3/uL (ref 4.0–10.5)

## 2017-04-05 LAB — SAMPLE TO BLOOD BANK

## 2017-04-05 LAB — CDS SEROLOGY

## 2017-04-05 MED ORDER — TETANUS-DIPHTH-ACELL PERTUSSIS 5-2.5-18.5 LF-MCG/0.5 IM SUSP
0.5000 mL | Freq: Once | INTRAMUSCULAR | Status: AC
  Administered 2017-04-05: 0.5 mL via INTRAMUSCULAR
  Filled 2017-04-05: qty 0.5

## 2017-04-05 MED ORDER — ONDANSETRON 4 MG PO TBDP
4.0000 mg | ORAL_TABLET | Freq: Once | ORAL | Status: AC
  Administered 2017-04-05: 4 mg via ORAL

## 2017-04-05 MED ORDER — ONDANSETRON 4 MG PO TBDP
ORAL_TABLET | ORAL | Status: AC
  Filled 2017-04-05: qty 1

## 2017-04-05 MED ORDER — ACETAMINOPHEN 500 MG PO TABS: 1000.0000 mg | ORAL_TABLET | Freq: Three times a day (TID) | ORAL | 0 refills | Status: DC

## 2017-04-05 MED ORDER — LIDOCAINE-EPINEPHRINE (PF) 2 %-1:200000 IJ SOLN: 10.0000 mL | Freq: Once | INTRAMUSCULAR | Status: DC

## 2017-04-05 MED ORDER — ACETAMINOPHEN 500 MG PO TABS: 1000.0000 mg | ORAL_TABLET | Freq: Three times a day (TID) | ORAL | 0 refills | Status: AC

## 2017-04-05 MED ORDER — IOPAMIDOL (ISOVUE-370) INJECTION 76%
INTRAVENOUS | Status: AC
  Administered 2017-04-05: 50 mL
  Filled 2017-04-05: qty 50

## 2017-04-05 MED ORDER — FENTANYL CITRATE (PF) 100 MCG/2ML IJ SOLN
50.0000 ug | Freq: Once | INTRAMUSCULAR | Status: AC
  Administered 2017-04-05: 50 ug via INTRAVENOUS
  Filled 2017-04-05: qty 2

## 2017-04-05 MED ORDER — SODIUM CHLORIDE 0.9 % IV BOLUS (SEPSIS)
1000.0000 mL | Freq: Once | INTRAVENOUS | Status: AC
  Administered 2017-04-05: 1000 mL via INTRAVENOUS

## 2017-04-05 NOTE — Progress Notes (Signed)
Orthopedic Tech Progress Note Patient Details:  Paul May [birth year] [id number]  Patient ID: Paul May, male   DOB: [birth year], 52 y.o.   MRN: [id number]   Nikki DomCrawford, Gracyn Allor 04/05/2017, 1:32 PM Made level 2 trauma visit

## 2017-04-05 NOTE — Discharge Instructions (Addendum)
Please call the concussion clinic to follow-up closely to that they can monitor your progression.  Please return to the emergency department in 5-7 days for staple removal.

## 2017-04-05 NOTE — ED Notes (Signed)
Pt was nauseated when sitting up-- zofran given-- stressed liquids only tonight--

## 2017-04-05 NOTE — ED Notes (Signed)
Lab results reported to Nurse Clydie BraunKaren.

## 2017-04-05 NOTE — ED Provider Notes (Addendum)
[facility] EMERGENCY DEPARTMENT Provider Note  CSN: [id number] Arrival date & time: 04/05/17 1315  Chief Complaint(s) Head Injury and level 2 trauma  HPI Paul May is a 52 y.o. male with no known past medical history presents to the emergency department as a level 2 trauma after head injury.  History is limited due to patient's altered mental status.  History obtained by EMS.  EMS reported that the patient was at work and was struck in the head by a jack.  Details surrounding this not well known as this was not witnessed by any other coworkers.  Apparently coworkers heard a loud sound and found the patient on the floor.  Upon EMS arrival patient was alert but nonverbal.  He remained hemodynamically stable in route.  Remainder of history, ROS, and physical exam limited due to patient's condition (AMS). Additional information was obtained from EMS.   Level V Caveat.    HPI  Past Medical History History reviewed. No pertinent past medical history. There are no active problems to display for this patient.  Home Medication(s) Prior to Admission medications   Medication Sig Start Date End Date Taking? Authorizing Provider  OVER THE COUNTER MEDICATION Take 1 tablet by mouth daily.   Yes [provider]  acetaminophen (TYLENOL) 500 MG tablet Take 2 tablets (1,000 mg total) by mouth every 8 (eight) hours for 5 days. Do not take more than 4000 mg of acetaminophen (Tylenol) in a 24-hour period. Please note that other medicines that you may be prescribed may have Tylenol as well. 04/05/17 04/10/17  Nira Conn, MD  doxycycline (VIBRAMYCIN) 50 MG capsule Take 1 capsule (50 mg total) by mouth 2 (two) times daily. Patient not taking: Reported on 04/05/2017 10/16/15   Cindee Salt, MD  HYDROcodone-acetaminophen (NORCO) 5-325 MG tablet Take 1 tablet by mouth every 6 (six) hours as needed for moderate pain. Patient not taking: Reported on 04/05/2017 10/16/15    Cindee Salt, MD  HYDROcodone-acetaminophen (NORCO/VICODIN) 5-325 MG tablet Take 1 tablet by mouth every 4 (four) hours as needed for moderate pain. Patient not taking: Reported on 04/05/2017 10/09/15   Cuthriell, Delorise Royals, PA-C  meloxicam (MOBIC) 15 MG tablet Take 1 tablet (15 mg total) by mouth daily. Patient not taking: Reported on 04/05/2017 10/09/15   Cuthriell, Delorise Royals, PA-C                                                                                                                                    Past Surgical History Past Surgical History:  Procedure Laterality Date  . NAILBED REPAIR Bilateral 10/16/2015   Procedure: RIGHT SMALL FINGER AND LEFT RING FINGER NAILBED REPAIRS;  Surgeon: Cindee Salt, MD;  Location: [facility];  Service: Orthopedics;  Laterality: Bilateral;   Family History No family history on file.  Social History Social History   Tobacco Use  . Smoking status: Never Smoker  .  Smokeless tobacco: Never Used  Substance Use Topics  . Alcohol use: Yes  . Drug use: No   Allergies Patient has no known allergies.  Review of Systems Review of Systems  Unable to perform ROS: Patient nonverbal    Physical Exam Vital Signs  I have reviewed the triage vital signs BP 122/85   Pulse 71   Temp 99.7 F (37.6 C) (Temporal)   Resp 16   Ht 5\' 7"  (1.702 m)   Wt 74.8 kg (165 lb)   SpO2 93%   BMI 25.84 kg/m   Physical Exam  Constitutional: He appears well-developed and well-nourished. No distress.  HENT:  Head: Normocephalic. Head is with laceration (4-5 cm linear laceration to the left parietal scalp.  Hemostatic.).  Right Ear: External ear normal.  Left Ear: External ear normal.  Mouth/Throat: Oropharynx is clear and moist.  Eyes: Conjunctivae and EOM are normal. Pupils are equal, round, and reactive to light. Right eye exhibits no discharge. Left eye exhibits no discharge. No scleral icterus.  Neck: Normal range of motion. Neck supple.    Cardiovascular: Regular rhythm and normal heart sounds. Exam reveals no gallop and no friction rub.  No murmur heard. Pulses:      Radial pulses are 2+ on the right side, and 2+ on the left side.       Dorsalis pedis pulses are 2+ on the right side, and 2+ on the left side.  Pulmonary/Chest: Effort normal and breath sounds normal. No stridor. No respiratory distress.  Abdominal: Soft. He exhibits no distension. There is no tenderness.  Musculoskeletal:       Cervical back: He exhibits no bony tenderness.       Thoracic back: He exhibits no bony tenderness.       Lumbar back: He exhibits no bony tenderness.  Clavicle stable. Chest stable to AP/Lat compression. Pelvis stable to Lat compression. No obvious extremity deformity. No chest or abdominal wall contusion.  Neurological: He is alert. GCS eye subscore is 4. GCS verbal subscore is 5. GCS motor subscore is 6.  Patient is nonverbal, but follows commands. Moving all extremities.   Skin: Skin is warm. He is not diaphoretic.    ED Results and Treatments Labs (all labs ordered are listed, but only abnormal results are displayed) Labs Reviewed  COMPREHENSIVE METABOLIC PANEL - Abnormal; Notable for the following components:      Result Value   Glucose, Bld 121 (*)    AST 44 (*)    All other components within normal limits  CBC - Abnormal; Notable for the following components:   RBC 5.84 (*)    Hemoglobin 18.6 (*)    MCHC 36.5 (*)    Platelets 120 (*)    All other components within normal limits  URINALYSIS, ROUTINE W REFLEX MICROSCOPIC - Abnormal; Notable for the following components:   Color, Urine STRAW (*)    All other components within normal limits  I-STAT CHEM 8, ED - Abnormal; Notable for the following components:   Creatinine, Ser 0.60 (*)    Glucose, Bld 117 (*)    Calcium, Ion 1.03 (*)    Hemoglobin 17.7 (*)    All other components within normal limits  I-STAT CG4 LACTIC ACID, ED - Abnormal; Notable for the  following components:   Lactic Acid, Venous 2.26 (*)    All other components within normal limits  ETHANOL  CDS SEROLOGY  PROTIME-INR  SAMPLE TO BLOOD BANK  EKG  EKG Interpretation  Date/Time:    Ventricular Rate:    PR Interval:    QRS Duration:   QT Interval:    QTC Calculation:   R Axis:     Text Interpretation:        Radiology Ct Angio Head W Or Wo Contrast  Result Date: 04/05/2017 CLINICAL DATA:  Struck on the top of the head by machinery. Loss of consciousness. Not speaking. EXAM: CT ANGIOGRAPHY HEAD AND NECK TECHNIQUE: Multidetector CT imaging of the head and neck was performed using the standard protocol during bolus administration of intravenous contrast. Multiplanar CT image reconstructions and MIPs were obtained to evaluate the vascular anatomy. Carotid stenosis measurements (when applicable) are obtained utilizing NASCET criteria, using the distal internal carotid diameter as the denominator. CONTRAST:  50mL ISOVUE-370 IOPAMIDOL (ISOVUE-370) INJECTION 76% COMPARISON:  None. FINDINGS: CT HEAD FINDINGS Brain: The brain shows a normal appearance without evidence of malformation, atrophy, old or acute small or large vessel infarction, mass lesion, hemorrhage, hydrocephalus or extra-axial collection. Vascular: No hyperdense vessel. No evidence of atherosclerotic calcification. Skull: Normal.  No traumatic finding.  No focal bone lesion. Sinuses/Orbits: Sinuses are clear. Orbits appear normal. Mastoids are clear. Other: Scalp injury at the left parietal vertex. No sign of radiopaque foreign object. CTA NECK FINDINGS Aortic arch: Normal Right carotid system: Normal. No bifurcation disease. No cervical ICA abnormality. Left carotid system: Normal. No bifurcation disease. No cervical ICA abnormality. Vertebral arteries: Vertebral artery origins are widely patent. Both  vertebral arteries are widely patent through the cervical region to the foramen magnum. Skeleton: Normal Other neck: No soft tissue mass or neck injury identified. Upper chest: Normal Review of the MIP images confirms the above findings CTA HEAD FINDINGS Anterior circulation: Both internal carotid arteries are widely patent through the skull base and siphon regions. No atherosclerotic calcification. Anterior and middle cerebral vessels are normal. Posterior circulation: Both vertebral arteries are widely patent through the foramen magnum to the basilar. No basilar stenosis. Posterior circulation branch vessels are normal. Venous sinuses: Normal Anatomic variants: None Delayed phase: No abnormal enhancement Review of the MIP images confirms the above findings IMPRESSION: Head CT normal except for a left parietal scalp laceration. CT angiography of the neck and head are normal. Electronically Signed   By: Paulina FusiMark  Shogry M.D.   On: 04/05/2017 14:07   Dg Forearm Right  Result Date: 04/05/2017 CLINICAL DATA:  Level 2 trauma. Patient was crushed by a piece of machinery at work today. EXAM: RIGHT FOREARM - 2 VIEW COMPARISON:  None. FINDINGS: There is no evidence of fracture or other focal bone lesions. No joint dislocations. Normal vascular channel involving the proximal radial diaphysis. Slight irregular soft tissue swelling along the ulnar and dorsal aspect of the forearm as well as along the volar aspect of the distal forearm. Minimal enthesopathy at the triceps insertion of the olecranon. IMPRESSION: Soft tissue swelling of the right forearm. No acute osseous abnormality. Electronically Signed   By: Tollie Ethavid  Kwon M.D.   On: 04/05/2017 14:57   Ct Angio Neck W And/or Wo Contrast  Result Date: 04/05/2017 CLINICAL DATA:  Struck on the top of the head by machinery. Loss of consciousness. Not speaking. EXAM: CT ANGIOGRAPHY HEAD AND NECK TECHNIQUE: Multidetector CT imaging of the head and neck was performed using the  standard protocol during bolus administration of intravenous contrast. Multiplanar CT image reconstructions and MIPs were obtained to evaluate the vascular anatomy. Carotid stenosis measurements (when applicable) are obtained utilizing  NASCET criteria, using the distal internal carotid diameter as the denominator. CONTRAST:  50mL ISOVUE-370 IOPAMIDOL (ISOVUE-370) INJECTION 76% COMPARISON:  None. FINDINGS: CT HEAD FINDINGS Brain: The brain shows a normal appearance without evidence of malformation, atrophy, old or acute small or large vessel infarction, mass lesion, hemorrhage, hydrocephalus or extra-axial collection. Vascular: No hyperdense vessel. No evidence of atherosclerotic calcification. Skull: Normal.  No traumatic finding.  No focal bone lesion. Sinuses/Orbits: Sinuses are clear. Orbits appear normal. Mastoids are clear. Other: Scalp injury at the left parietal vertex. No sign of radiopaque foreign object. CTA NECK FINDINGS Aortic arch: Normal Right carotid system: Normal. No bifurcation disease. No cervical ICA abnormality. Left carotid system: Normal. No bifurcation disease. No cervical ICA abnormality. Vertebral arteries: Vertebral artery origins are widely patent. Both vertebral arteries are widely patent through the cervical region to the foramen magnum. Skeleton: Normal Other neck: No soft tissue mass or neck injury identified. Upper chest: Normal Review of the MIP images confirms the above findings CTA HEAD FINDINGS Anterior circulation: Both internal carotid arteries are widely patent through the skull base and siphon regions. No atherosclerotic calcification. Anterior and middle cerebral vessels are normal. Posterior circulation: Both vertebral arteries are widely patent through the foramen magnum to the basilar. No basilar stenosis. Posterior circulation branch vessels are normal. Venous sinuses: Normal Anatomic variants: None Delayed phase: No abnormal enhancement Review of the MIP images confirms  the above findings IMPRESSION: Head CT normal except for a left parietal scalp laceration. CT angiography of the neck and head are normal. Electronically Signed   By: Paulina Fusi M.D.   On: 04/05/2017 14:07   Ct C-spine No Charge  Result Date: 04/05/2017 CLINICAL DATA:  52 year old male status post level 2 trauma, heavy object fell on top of the patient's head. Loss of consciousness. EXAM: CT Cervical Spine with contrast TECHNIQUE: Multiplanar CT images of the cervical spine were reconstructed from contemporary CTA of the Neck. CONTRAST:  None additional COMPARISON:  CTA head and neck today reported separately. FINDINGS: Alignment: Straightening of cervical lordosis. No spondylolisthesis. Bilateral posterior element alignment is within normal limits. Vertebrae: Visualized skull base is intact. No atlanto-occipital dissociation. C1 and C2 are intact and normally aligned. No acute cervical spine fracture is identified. There is some loss of height of the C6 vertebral body (sagittal image 34), but this appears to be chronic or congenital. Soft tissues: No hemorrhage identified within the bony spinal canal. No prevertebral soft tissue fluid or edema identified. Other paraspinal soft tissue findings are reported with the CTA today separately. Disc levels: Cervical disc space loss and endplate degeneration with circumferential endplate spurring at C5-C6 and C6-C7. Up to mild associated spinal stenosis. Upper chest: Visible upper thoracic levels appear intact. Negative lung apices. Other: Visualized paranasal sinuses and mastoids are well pneumatized. Elongated bilateral stylohyoid ligament calcification, greater on the right. IMPRESSION: 1.  No acute osseous abnormality in the cervical spine. 2. CTA head and neck today are reported separately. 3. 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 Electronically Signed   By: Odessa Fleming M.D.   On: 04/05/2017 14:14    Pertinent labs & imaging results that were available during my care of the patient were reviewed by me and considered in my medical decision making (see chart for details).  Medications Ordered in ED Medications  iopamidol (ISOVUE-370) 76 % injection (50 mLs  Contrast Given 04/05/17 1330)  fentaNYL (SUBLIMAZE) injection 50 mcg (50 mcg Intravenous  Given 04/05/17 1412)  Tdap (BOOSTRIX) injection 0.5 mL (0.5 mLs Intramuscular Given 04/05/17 1419)  sodium chloride 0.9 % bolus 1,000 mL (0 mLs Intravenous Stopped 04/05/17 1705)  ondansetron (ZOFRAN-ODT) disintegrating tablet 4 mg (4 mg Oral Given 04/05/17 1816)                                                                                                                                    Procedures .Marland KitchenLaceration Repair Date/Time: 04/05/2017 3:14 PM Performed by: Nira Conn, MD Authorized by: Nira Conn, MD   Consent:    Consent obtained:  Verbal   Consent given by:  Spouse   Risks discussed:  Poor cosmetic result   Alternatives discussed:  No treatment Laceration details:    Location:  Scalp   Scalp location:  L parietal   Length (cm):  4   Depth (mm):  3 Repair type:    Repair type:  Simple Pre-procedure details:    Preparation:  Patient was prepped and draped in usual sterile fashion and imaging obtained to evaluate for foreign bodies Exploration:    Contaminated: no   Treatment:    Area cleansed with:  Betadine   Amount of cleaning:  Standard   Irrigation solution:  Sterile saline   Irrigation method:  Syringe Skin repair:    Repair method:  Staples   Number of staples:  3 Approximation:    Approximation:  Close   Vermilion border: well-aligned   Post-procedure details:    Dressing:  Antibiotic ointment   Patient tolerance of procedure:  Tolerated well, no immediate complications    (including critical care time)  Medical Decision Making / ED Course I have reviewed the nursing notes for this encounter  and the patient's prior records (if available in EHR or on provided paperwork).    Level 2 trauma due to altered mental status in the setting of head trauma.  Left parietal scalp laceration with no underlying instability.  Imaging reassuring without evidence of fracture, ICH.  Tetanus updated.  Laceration was closed with 3 staples.  Patient was monitored for several hours and is more interactive and now verbal.  Altered mental status likely from concussion which appears to be improving.  Patient has good support system at home with wife and daughter at bedside.  Recommended close follow-up with concussion clinic.  Return in 5-7 days for suture removal.  The patient is safe for discharge with strict return precautions.   Final Clinical Impression(s) / ED Diagnoses Final diagnoses:  Closed head injury, initial encounter  Laceration of scalp, initial encounter  Concussion with loss of consciousness of 30 minutes or less, initial encounter   Disposition: Discharge  Condition: Good  I have discussed the results, Dx and Tx plan with the patient who expressed understanding and agree(s) with the plan. Discharge instructions discussed at great length. The patient was given strict return precautions who verbalized understanding of the  instructions. No further questions at time of discharge.    ED Discharge Orders        Ordered    acetaminophen (TYLENOL) 500 MG tablet  Every 8 hours,   Status:  Discontinued     04/05/17 1631    acetaminophen (TYLENOL) 500 MG tablet  Every 8 hours     04/05/17 1705       Follow Up: Judi Saa, DO [address] [place] [address] [place] [address] [phone]  Schedule an appointment as soon as possible for a visit  For close follow up to assess for concussion  [facility] EMERGENCY DEPARTMENT [address] [id number] mc [place] [address] [phone] Go to  in 5-7 days, For suture removal      This  chart was dictated using voice recognition software.  Despite best efforts to proofread,  errors can occur which can change the documentation meaning.   Nira Conn, MD 04/06/17 1041    Nira Conn, MD 04/12/17 [phone]

## 2017-04-12 ENCOUNTER — Ambulatory Visit
Admission: RE | Admit: 2017-04-12 | Discharge: 2017-04-12 | Disposition: A | Discharge status: Home / Self Care | Payer: Worker's Compensation | Source: Ambulatory Visit | Attending: Family Medicine | Admitting: Family Medicine

## 2017-04-12 ENCOUNTER — Other Ambulatory Visit: Payer: Self-pay | Admitting: Family Medicine

## 2017-04-12 DIAGNOSIS — S62634A Displaced fracture of distal phalanx of right ring finger, initial encounter for closed fracture: Secondary | ICD-10-CM | POA: Insufficient documentation

## 2017-04-12 DIAGNOSIS — X58XXXA Exposure to other specified factors, initial encounter: Secondary | ICD-10-CM | POA: Diagnosis not present

## 2017-04-12 DIAGNOSIS — M79601 Pain in right arm: Secondary | ICD-10-CM

## 2017-04-12 DIAGNOSIS — T1490XA Injury, unspecified, initial encounter: Secondary | ICD-10-CM

## 2018-10-14 IMAGING — CT CT CERVICAL SPINE W/O CM
3 of 17 series · 9 of 33 positions shown, 10 images · IV contrast (agent unspecified)
Comparison: CTA head and neck today reported separately.

CLINICAL DATA: 51-year-old male status post level 2 trauma, heavy
object fell on top of the patient's head. Loss of consciousness.

EXAM:
CT Cervical Spine with contrast
TECHNIQUE: Multiplanar CT images of the cervical spine were reconstructed from
contemporary CTA of the Neck.
CONTRAST:  None additional

[Series 7: ax thins · axial · 0.39mm/px · z∈[-248,-76]mm · 3 of 343 slices shown, 4 images]
[im 86/343  soft-tissue]
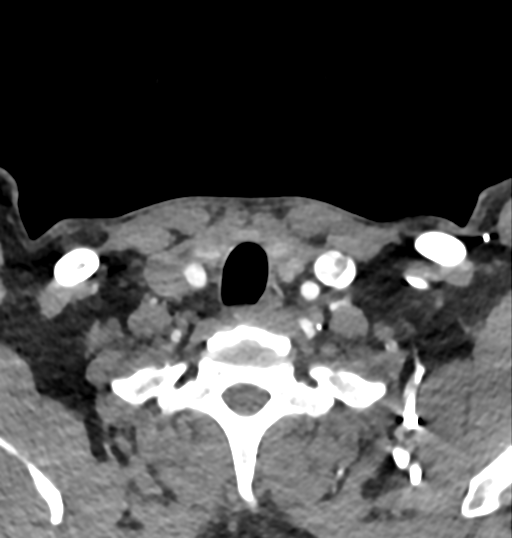
[im 86/343  bone]
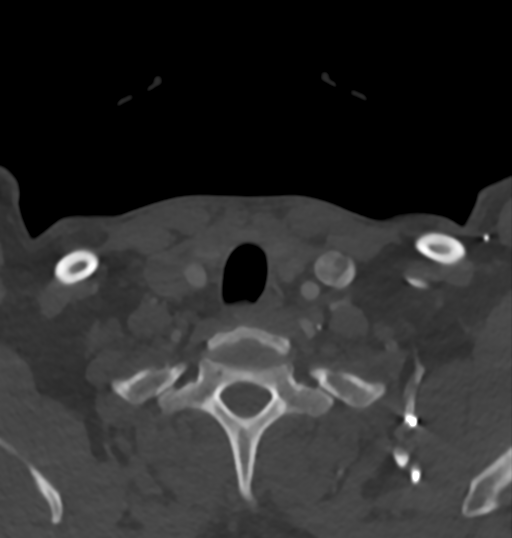
[im 172/343  bone]
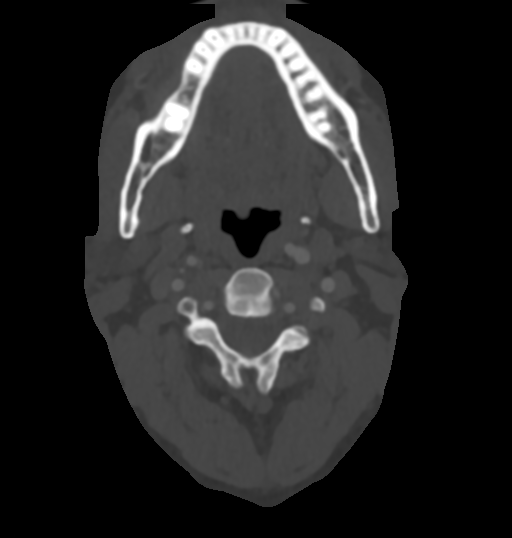
[im 257/343  bone]
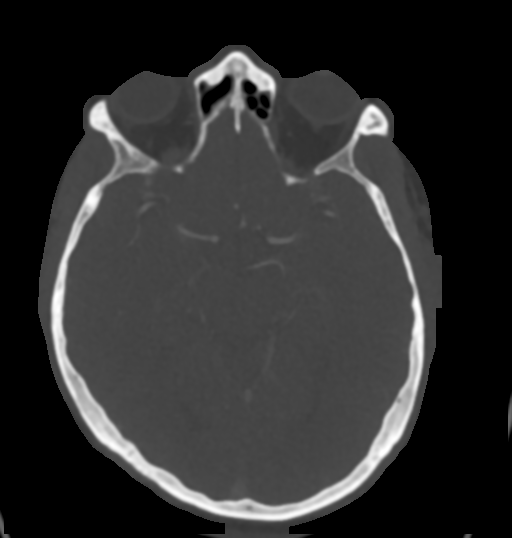

[Series 8: cor thins · coronal · 0.35mm/px · 1 of 201 slices shown]
[im 101/201  bone]
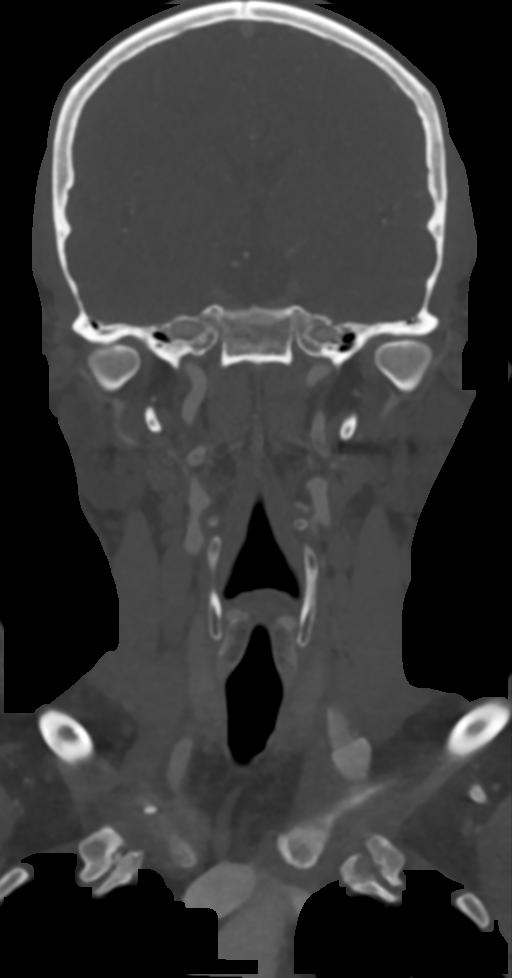

[Series 9: sag thins · sagittal · 0.45mm/px · 5 of 147 slices shown]
[im 25/147  bone]
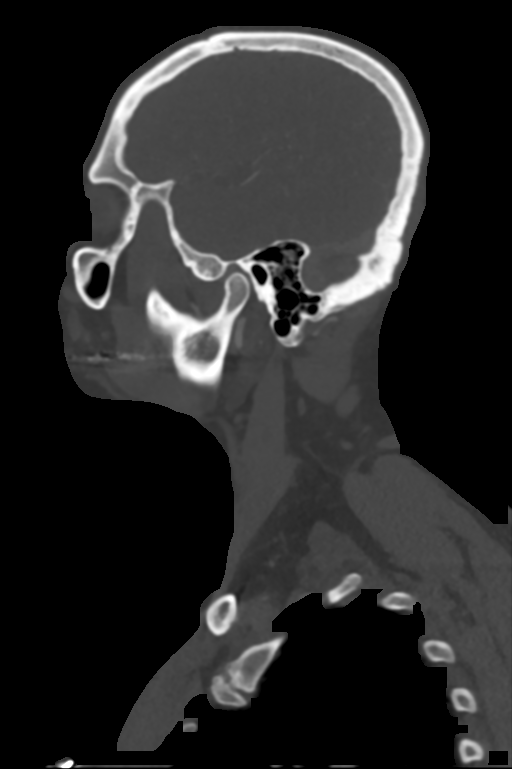
[im 49/147  bone]
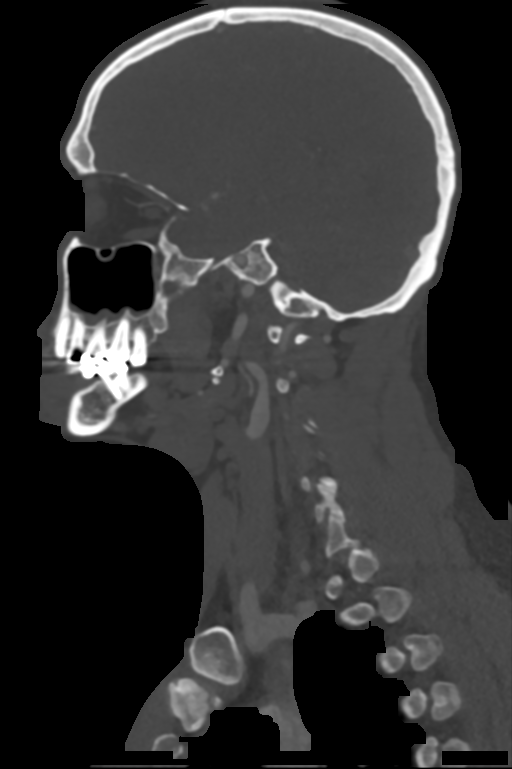
[im 74/147  bone]
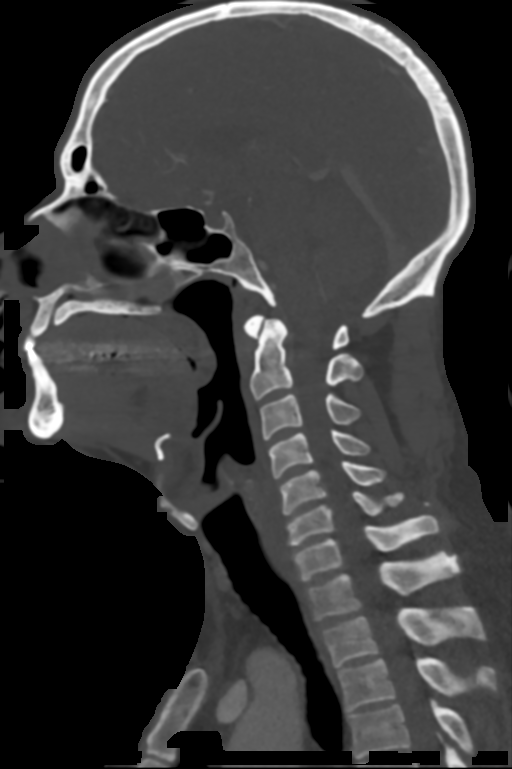
[im 98/147  bone]
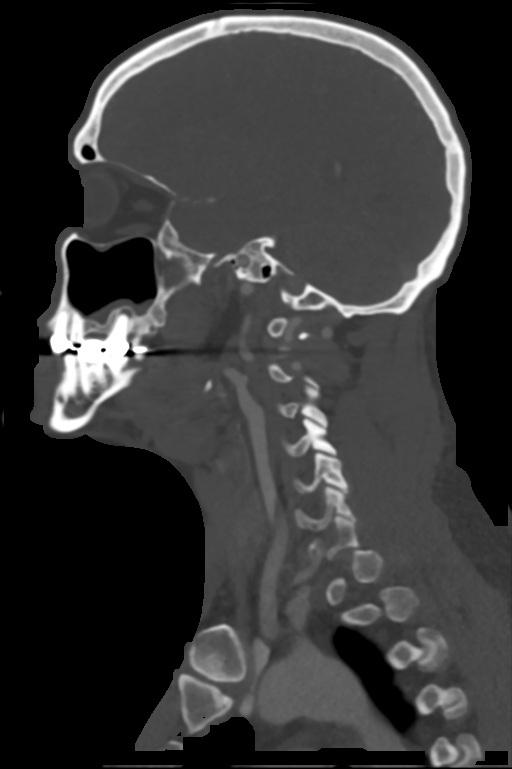
[im 122/147  bone]
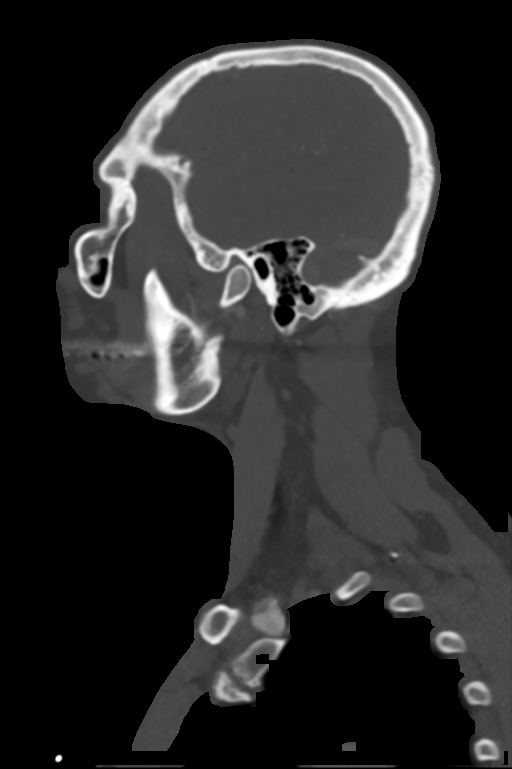

[9 of 33 positions shown; findings below may reference images not displayed]

FINDINGS: Alignment: Straightening of cervical lordosis. No spondylolisthesis.
Bilateral posterior element alignment is within normal limits.

Vertebrae: Visualized skull base is intact. No atlanto-occipital
dissociation. C1 and C2 are intact and normally aligned. No acute
cervical spine fracture is identified. There is some loss of height
of the C6 vertebral body (sagittal image 34), but this appears to be
chronic or congenital.

Soft tissues: No hemorrhage identified within the bony spinal canal.
No prevertebral soft tissue fluid or edema identified. Other
paraspinal soft tissue findings are reported with the CTA today
separately.

Disc levels: Cervical disc space loss and endplate degeneration with
circumferential endplate spurring at C5-C6 and C6-C7. Up to mild
associated spinal stenosis.

Upper chest: Visible upper thoracic levels appear intact. Negative
lung apices.

Other: Visualized paranasal sinuses and mastoids are well
pneumatized. Elongated bilateral stylohyoid ligament calcification,
greater on the right.
IMPRESSION: 1.  No acute osseous abnormality in the cervical spine.
2. CTA head and neck today are reported separately.
3. Lower cervical spine chronic disc and endplate degeneration with
up to mild degenerative spinal stenosis.

## 2020-06-20 ENCOUNTER — Emergency Department
Admission: EM | Admit: 2020-06-20 | Discharge: 2020-06-20 | Disposition: A | Discharge status: Home / Self Care | Payer: 59 | Attending: Emergency Medicine | Admitting: Emergency Medicine

## 2020-06-20 ENCOUNTER — Encounter: Payer: Self-pay | Admitting: Intensive Care

## 2020-06-20 ENCOUNTER — Other Ambulatory Visit: Payer: Self-pay

## 2020-06-20 DIAGNOSIS — S01311A Laceration without foreign body of right ear, initial encounter: Secondary | ICD-10-CM | POA: Insufficient documentation

## 2020-06-20 DIAGNOSIS — S01301A Unspecified open wound of right ear, initial encounter: Secondary | ICD-10-CM

## 2020-06-20 DIAGNOSIS — W08XXXA Fall from other furniture, initial encounter: Secondary | ICD-10-CM | POA: Diagnosis not present

## 2020-06-20 DIAGNOSIS — Y92008 Other place in unspecified non-institutional (private) residence as the place of occurrence of the external cause: Secondary | ICD-10-CM | POA: Insufficient documentation

## 2020-06-20 MED ORDER — LIDOCAINE HCL (PF) 1 % IJ SOLN
10.0000 mL | Freq: Once | INTRAMUSCULAR | Status: AC
  Administered 2020-06-20: 10 mL via INTRADERMAL
  Filled 2020-06-20: qty 10

## 2020-06-20 MED ORDER — LEVOFLOXACIN 500 MG PO TABS: 500.0000 mg | ORAL_TABLET | Freq: Every day | ORAL | 0 refills | Status: DC

## 2020-06-20 MED ORDER — OXYCODONE HCL 5 MG PO TABS: 5.0000 mg | ORAL_TABLET | Freq: Once | ORAL | Status: DC

## 2020-06-20 MED ORDER — LEVOFLOXACIN 500 MG PO TABS
500.0000 mg | ORAL_TABLET | Freq: Every day | ORAL | 0 refills | Status: AC
Start: 2020-06-20 — End: 2020-06-30

## 2020-06-20 NOTE — ED Notes (Signed)
Laceration to right ear. Pt states he was asleep on the couch, awoken by his phone ringing and somehow rolled off of the couch onto a dumbbell causing the laceration. Patient endorses pain, offered pain medicine ordered but declines, states "I'd rather just have an advil, I think that will hurt my stomach". Pt is a&o x4 with steady gait, denies loc or other symptoms.

## 2020-06-20 NOTE — ED Notes (Signed)
Patient offered oral pain medication and refused

## 2020-06-20 NOTE — Consult Note (Signed)
.. Jester, [place] [id number] 1965-02-10 Concha Se, MD  Reason for Consult: right ear laceration/avulsion  HPI: 55 y.o. male presented to ER this afternoon after injuring his right ear.  He reports he was taking a nap on the couch prior to a BBQ and rolled off and struck his ear on a weight lifting equipment.  Asked to evaluation due to tissue loss/avulsion injury.  Patient denies any prior issues with laceration repair.  Allergies:  Allergies  Allergen Reactions  . Bee Pollen     ROS: Review of systems normal other than 12 systems except per HPI.  PMH: History reviewed. No pertinent past medical history.  FH: History reviewed. No pertinent family history.  SH:  Social History   Socioeconomic History  . Marital status: Single    Spouse name: Not on file  . Number of children: Not on file  . Years of education: Not on file  . Highest education level: Not on file  Occupational History  . Not on file  Tobacco Use  . Smoking status: Never Smoker  . Smokeless tobacco: Never Used  Substance and Sexual Activity  . Alcohol use: Yes  . Drug use: No  . Sexual activity: Not on file  Other Topics Concern  . Not on file  Social History Narrative  . Not on file   Social Determinants of Health   Financial Resource Strain: Not on file  Food Insecurity: Not on file  Transportation Needs: Not on file  Physical Activity: Not on file  Stress: Not on file  Social Connections: Not on file  Intimate Partner Violence: Not on file    PSH:  Past Surgical History:  Procedure Laterality Date  . NAILBED REPAIR Bilateral 10/16/2015   Procedure: RIGHT SMALL FINGER AND LEFT RING FINGER NAILBED REPAIRS;  Surgeon: Cindee Salt, MD;  Location: [facility];  Service: Orthopedics;  Laterality: Bilateral;    Physical  Exam:  GEN-  NAD, supine in bed in ER NEURO- CN 2-12 grossly intact and symmetric. EARS-  Right antihelix and navicular fossa avulsion involving the lower  crus of antihelix.  Avulsion is pedicled on small piece of dermis and dermis appears to be dusky of the avulsed tissue.  Small laceration of helix.  EAC intact without involvement.  Posterior wall of ear is not violated.  Left ear is fine. OC/OP- Oral cavity, lips, gums, ororpharynx normal with no masses or lesions. EAT- Skin warm and dry.  NOSE- Nasal cavity without polyps or purulence. External nose and ears without masses or lesions. EYE- EOMI, PERRLA. NECK- Neck supple with no masses or lesions. No lymphadenopathy palpated. Thyroid normal with no masses.  Procedure:  Right ear debridement and multilayered wound closure.  After verbal consent was obtained and wound closure discussed.  Patient wished to have avulsed tissue removed instead of risking necrotic cartilage given questionable viability.  The patient's ear was injected with 1% plain lidocaine 4 cc's and then prepped and draped in a sterile fashion.  The wound was cleaned with sterile saline.  The avulsed tissue was again inspected and was noted to be non-viable.  Thus the avulsed cartilage and overlying skin of the navicular fossa and lower crus of antihelix were debrided.  Fragmented bits of cartilage from the remaining navicular fossa were cleaned up.  At this time, deep vicryl sutures were placed in the subcutaneous tissue to reapproximate the remainder of the skin of the navicular fossa with the remainder of the lower crus of  the antihelix.  Several vicryls were placed.  The skin was closed using interupted 5.0 Nylon suture.  A superior area of avulsed skin of the navicular fossa was unable to be closed primarily as well as small area just inferior to the remaining lower crus of the antihelix.  These were dressed with Xeroform gauze.  The patient tolerated the procedure well.   A/P: s/p Right ear laceration/avulsion debridement and repair.  Plan:  Discussed follow up with patient on Tuesday afternoon at [facility] for wound check.  He  is to keep remaining Xeroform in case new dressing is needed, otherwise it is ok to keep what he has in place until follow up.  Recommend anti-pseudomonas coverage due to extensive cartilage injury.  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  Recommend topical bacitracin ointment applied to laceration TID.   Bud Face 06/20/2020 6:35 PM

## 2020-06-20 NOTE — ED Notes (Signed)
Right ear dressed with nonstick gauze and coban. Patient educated on wound care and applying dressing to wound as needed, educated on importance of keeping wound clean and dry. Verbalizes understanding.

## 2020-06-20 NOTE — ED Triage Notes (Signed)
Patient presents with Right ear laceration

## 2020-06-20 NOTE — ED Provider Notes (Signed)
[facility] Emergency Department Provider Note  ____________________________________________  Time seen: Approximately 4:26 PM  I have reviewed the triage vital signs and the nursing notes.   HISTORY  Chief Complaint Ear Laceration   HPI Paul May is a 55 y.o. male who presents to the emergency department for treatment and evaluation of an accidental laceration to his right ear.   He states that he had fallen asleep on the couch and his phone ring.  He was still drowsy and thought that he was in his bed instead of on the couch.  He rolled off the couch and his ear struck the edge of some pushup bars that were on the floor. He has a laceration to the inside of the right ear.  History reviewed. No pertinent past medical history.  There are no problems to display for this patient.   Past Surgical History:  Procedure Laterality Date  . NAILBED REPAIR Bilateral 10/16/2015   Procedure: RIGHT SMALL FINGER AND LEFT RING FINGER NAILBED REPAIRS;  Surgeon: Cindee Salt, MD;  Location: [facility];  Service: Orthopedics;  Laterality: Bilateral;    Prior to Admission medications   Medication Sig Start Date End Date Taking? Authorizing Provider  doxycycline (VIBRAMYCIN) 50 MG capsule Take 1 capsule (50 mg total) by mouth 2 (two) times daily. Patient not taking: Reported on 04/05/2017 10/16/15   Cindee Salt, MD  HYDROcodone-acetaminophen (NORCO) 5-325 MG tablet Take 1 tablet by mouth every 6 (six) hours as needed for moderate pain. Patient not taking: Reported on 04/05/2017 10/16/15   Cindee Salt, MD  HYDROcodone-acetaminophen (NORCO/VICODIN) 5-325 MG tablet Take 1 tablet by mouth every 4 (four) hours as needed for moderate pain. Patient not taking: Reported on 04/05/2017 10/09/15   Cuthriell, Delorise Royals, PA-C  levofloxacin (LEVAQUIN) 500 MG tablet Take 1 tablet (500 mg total) by mouth daily for 10 days. 06/20/20 06/30/20  Esa Raden, Rulon Eisenmenger B, FNP  meloxicam  (MOBIC) 15 MG tablet Take 1 tablet (15 mg total) by mouth daily. Patient not taking: Reported on 04/05/2017 10/09/15   Cuthriell, Delorise Royals, PA-C  OVER THE COUNTER MEDICATION Take 1 tablet by mouth daily.    [provider]    Allergies Bee pollen  History reviewed. No pertinent family history.  Social History Social History   Tobacco Use  . Smoking status: Never Smoker  . Smokeless tobacco: Never Used  Substance Use Topics  . Alcohol use: Yes  . Drug use: No    Review of Systems  Constitutional: Negative for fever. Respiratory: Negative for cough or shortness of breath.  Musculoskeletal: Negative for myalgias Skin: Positive for laceration. Neurological: Negative for numbness or paresthesias. ____________________________________________   PHYSICAL EXAM:  VITAL SIGNS: ED Triage Vitals  Enc Vitals Group     BP 06/20/20 1435 (!) 135/95     Pulse Rate 06/20/20 1435 78     Resp 06/20/20 1435 18     Temp 06/20/20 1435 98.4 F (36.9 C)     Temp Source 06/20/20 1435 Oral     SpO2 06/20/20 1435 95 %     Weight 06/20/20 1437 165 lb (74.8 kg)     Height 06/20/20 1437 5\' 5"  (1.651 m)     Head Circumference --      Peak Flow --      Pain Score 06/20/20 1436 8     Pain Loc --      Pain Edu? --      Excl. in GC? --  Constitutional: Overall well appearing. Eyes: Conjunctivae are clear without discharge or drainage. Nose: No rhinorrhea noted. Mouth/Throat: Airway is patent.  Neck: No stridor. Unrestricted range of motion observed. Cardiovascular: Capillary refill is <3 seconds.  Respiratory: Respirations are even and unlabored.. Musculoskeletal: Unrestricted range of motion observed. Neurologic: Awake, alert, and oriented x 4.  Skin:       ____________________________________________   LABS (all labs ordered are listed, but only abnormal results are displayed)  Labs Reviewed - No data to  display ____________________________________________  EKG  Not indicated. ____________________________________________  RADIOLOGY  Not indicated. ____________________________________________   PROCEDURES  Procedures: See Dr. Gregary Cromer note. ____________________________________________   INITIAL IMPRESSION / ASSESSMENT AND PLAN / ED COURSE  Paul May is a 55 y.o. male presents to the emergency department for treatment and evaluation of laceration to the right ear. See HPI for further details. He is up to date with his tetanus booster. (4 years ago).   ----------------------------------------- 5:05 PM on 06/20/2020 -----------------------------------------  Spoke with Dr. Andee Poles, on call for ENT. He will view image in chart and call back with recommendations.  ----------------------------------------- 6:22 PM on 06/20/2020 -----------------------------------------  Dr. Andee Poles in to see patient. Unable to reattach skin flap. Avulsed area removed. Hemostasis achieved and dressing applied.  Patient discharged home to follow up with Dr. Andee Poles on Tuesday. Patient is aware to do so. Wound care discussed.   Medications  lidocaine (PF) (XYLOCAINE) 1 % injection 10 mL (10 mLs Intradermal Given 06/20/20 1835)     Pertinent labs & imaging results that were available during my care of the patient were reviewed by me and considered in my medical decision making (see chart for details).  ____________________________________________   FINAL CLINICAL IMPRESSION(S) / ED DIAGNOSES  Final diagnoses:  Ear avulsion, right, initial encounter    ED Discharge Orders         Ordered    levofloxacin (LEVAQUIN) 500 MG tablet  Daily,   Status:  Discontinued        06/20/20 1813    levofloxacin (LEVAQUIN) 500 MG tablet  Daily,   Status:  Discontinued        06/20/20 1821    levofloxacin (LEVAQUIN) 500 MG tablet  Daily,   Status:  Discontinued        06/20/20 1822     levofloxacin (LEVAQUIN) 500 MG tablet  Daily        06/20/20 1824           Note:  This document was prepared using Dragon voice recognition software and may include unintentional dictation errors.   Chinita Pester, FNP 06/21/20 1726    Concha Se, MD 06/21/20 [phone]
